# Patient Record
Sex: Female | Born: 1985 | Hispanic: No | Marital: Married
Health system: Southern US, Community
[De-identification: ages and names within clinical notes are randomized; demographics above are authoritative.]

---

## 2016-02-29 ENCOUNTER — Other Ambulatory Visit (HOSPITAL_COMMUNITY): Payer: Self-pay | Admitting: Obstetrics and Gynecology

## 2016-02-29 DIAGNOSIS — Z3A18 18 weeks gestation of pregnancy: Secondary | ICD-10-CM

## 2016-02-29 DIAGNOSIS — Z3689 Encounter for other specified antenatal screening: Secondary | ICD-10-CM

## 2016-02-29 LAB — OB RESULTS CONSOLE HEPATITIS B SURFACE ANTIGEN: Hepatitis B Surface Ag: NEGATIVE

## 2016-02-29 LAB — OB RESULTS CONSOLE HIV ANTIBODY (ROUTINE TESTING): HIV: NONREACTIVE

## 2016-02-29 LAB — OB RESULTS CONSOLE GC/CHLAMYDIA
Chlamydia: NEGATIVE
GC PROBE AMP, GENITAL: NEGATIVE

## 2016-02-29 LAB — OB RESULTS CONSOLE ABO/RH: RH TYPE: POSITIVE

## 2016-02-29 LAB — OB RESULTS CONSOLE ANTIBODY SCREEN: ANTIBODY SCREEN: NEGATIVE

## 2016-02-29 LAB — OB RESULTS CONSOLE RUBELLA ANTIBODY, IGM: Rubella: IMMUNE

## 2016-02-29 LAB — OB RESULTS CONSOLE RPR: RPR: NONREACTIVE

## 2016-03-15 ENCOUNTER — Encounter (HOSPITAL_COMMUNITY): Payer: Self-pay | Admitting: Obstetrics and Gynecology

## 2016-03-29 ENCOUNTER — Encounter (HOSPITAL_COMMUNITY): Payer: Self-pay | Admitting: *Deleted

## 2016-03-30 ENCOUNTER — Ambulatory Visit (HOSPITAL_COMMUNITY)
Admission: RE | Admit: 2016-03-30 | Discharge: 2016-03-30 | Disposition: A | Discharge status: Home / Self Care | Payer: Medicaid Other | Source: Ambulatory Visit | Attending: Obstetrics and Gynecology | Admitting: Obstetrics and Gynecology

## 2016-03-30 DIAGNOSIS — Z363 Encounter for antenatal screening for malformations: Secondary | ICD-10-CM | POA: Insufficient documentation

## 2016-03-30 DIAGNOSIS — Z3689 Encounter for other specified antenatal screening: Secondary | ICD-10-CM

## 2016-03-30 DIAGNOSIS — Z3A18 18 weeks gestation of pregnancy: Secondary | ICD-10-CM | POA: Insufficient documentation

## 2016-03-30 DIAGNOSIS — O34211 Maternal care for low transverse scar from previous cesarean delivery: Secondary | ICD-10-CM | POA: Insufficient documentation

## 2016-07-26 ENCOUNTER — Encounter (HOSPITAL_COMMUNITY): Payer: Self-pay

## 2016-08-29 ENCOUNTER — Telehealth (HOSPITAL_COMMUNITY): Payer: Self-pay | Admitting: *Deleted

## 2016-08-29 NOTE — Telephone Encounter (Signed)
Preadmission screen  

## 2016-09-02 ENCOUNTER — Encounter (HOSPITAL_COMMUNITY)
Admission: RE | Disposition: A | Discharge status: Home / Self Care | Payer: Self-pay | Source: Ambulatory Visit | Attending: Obstetrics & Gynecology

## 2016-09-02 ENCOUNTER — Inpatient Hospital Stay (HOSPITAL_COMMUNITY): Payer: 59 | Admitting: Anesthesiology

## 2016-09-02 ENCOUNTER — Encounter (HOSPITAL_COMMUNITY): Payer: Self-pay | Admitting: *Deleted

## 2016-09-02 ENCOUNTER — Inpatient Hospital Stay (HOSPITAL_COMMUNITY)
Admission: RE | Admit: 2016-09-02 | Discharge: 2016-09-04 | DRG: 766 | Disposition: A | Discharge status: Home / Self Care | Payer: 59 | Source: Ambulatory Visit | Attending: Obstetrics & Gynecology | Admitting: Obstetrics & Gynecology

## 2016-09-02 DIAGNOSIS — Z302 Encounter for sterilization: Secondary | ICD-10-CM | POA: Diagnosis not present

## 2016-09-02 DIAGNOSIS — O34211 Maternal care for low transverse scar from previous cesarean delivery: Secondary | ICD-10-CM

## 2016-09-02 DIAGNOSIS — Z3A40 40 weeks gestation of pregnancy: Secondary | ICD-10-CM | POA: Diagnosis not present

## 2016-09-02 DIAGNOSIS — O34219 Maternal care for unspecified type scar from previous cesarean delivery: Secondary | ICD-10-CM | POA: Diagnosis not present

## 2016-09-02 LAB — CBC
HEMATOCRIT: 39.7 % (ref 36.0–46.0)
HEMATOCRIT: 38.6 % (ref 36.0–46.0)
HEMOGLOBIN: 13.9 g/dL (ref 12.0–15.0)
Hemoglobin: 13.6 g/dL (ref 12.0–15.0)
MCH: 33.7 pg (ref 26.0–34.0)
MCH: 33.7 pg (ref 26.0–34.0)
MCHC: 35 g/dL (ref 30.0–36.0)
MCHC: 35.2 g/dL (ref 30.0–36.0)
MCV: 96.4 fL (ref 78.0–100.0)
MCV: 95.8 fL (ref 78.0–100.0)
Platelets: 229 10*3/uL (ref 150–400)
Platelets: 225 10*3/uL (ref 150–400)
RBC: 4.12 MIL/uL (ref 3.87–5.11)
RBC: 4.03 MIL/uL (ref 3.87–5.11)
RDW: 13.4 % (ref 11.5–15.5)
RDW: 13.1 % (ref 11.5–15.5)
WBC: 11.8 10*3/uL — AB (ref 4.0–10.5)
WBC: 20.4 10*3/uL — ABNORMAL HIGH (ref 4.0–10.5)

## 2016-09-02 LAB — TYPE AND SCREEN
ABO/RH(D): A POS
ANTIBODY SCREEN: NEGATIVE

## 2016-09-02 LAB — ABO/RH: ABO/RH(D): A POS

## 2016-09-02 SURGERY — Surgical Case
Anesthesia: Spinal | Site: Abdomen | Wound class: Clean Contaminated

## 2016-09-02 MED ORDER — SCOPOLAMINE 1 MG/3DAYS TD PT72: 1.0000 | MEDICATED_PATCH | Freq: Once | TRANSDERMAL | Status: DC

## 2016-09-02 MED ORDER — MEPERIDINE HCL 25 MG/ML IJ SOLN: 6.2500 mg | INTRAMUSCULAR | Status: DC | PRN

## 2016-09-02 MED ORDER — ONDANSETRON HCL 4 MG/2ML IJ SOLN
INTRAMUSCULAR | Status: DC | PRN
  Administered 2016-09-02: 4 mg via INTRAVENOUS

## 2016-09-02 MED ORDER — PHENYLEPHRINE HCL 10 MG/ML IJ SOLN
INTRAMUSCULAR | Status: DC | PRN
  Administered 2016-09-02 (×2): 40 ug via INTRAVENOUS

## 2016-09-02 MED ORDER — SOD CITRATE-CITRIC ACID 500-334 MG/5ML PO SOLN
ORAL | Status: AC
  Filled 2016-09-02: qty 15

## 2016-09-02 MED ORDER — DEXAMETHASONE SODIUM PHOSPHATE 10 MG/ML IJ SOLN
INTRAMUSCULAR | Status: DC | PRN
  Administered 2016-09-02: 10 mg via INTRAVENOUS

## 2016-09-02 MED ORDER — KETOROLAC TROMETHAMINE 30 MG/ML IJ SOLN
30.0000 mg | Freq: Once | INTRAMUSCULAR | Status: DC | PRN
  Administered 2016-09-02: 30 mg via INTRAVENOUS

## 2016-09-02 MED ORDER — FENTANYL CITRATE (PF) 100 MCG/2ML IJ SOLN
INTRAMUSCULAR | Status: AC
  Filled 2016-09-02: qty 2

## 2016-09-02 MED ORDER — NALBUPHINE HCL 10 MG/ML IJ SOLN: 5.0000 mg | Freq: Once | INTRAMUSCULAR | Status: DC | PRN

## 2016-09-02 MED ORDER — PHENYLEPHRINE 8 MG IN D5W 100 ML (0.08MG/ML) PREMIX OPTIME
INJECTION | INTRAVENOUS | Status: DC | PRN
  Administered 2016-09-02: 60 ug/min via INTRAVENOUS

## 2016-09-02 MED ORDER — NALOXONE HCL 2 MG/2ML IJ SOSY: 1.0000 ug/kg/h | PREFILLED_SYRINGE | INTRAVENOUS | Status: DC | PRN

## 2016-09-02 MED ORDER — ONDANSETRON HCL 4 MG/2ML IJ SOLN: 4.0000 mg | Freq: Three times a day (TID) | INTRAMUSCULAR | Status: DC | PRN

## 2016-09-02 MED ORDER — BUPIVACAINE IN DEXTROSE 0.75-8.25 % IT SOLN
INTRATHECAL | Status: DC | PRN
  Administered 2016-09-02: 12 mg via INTRATHECAL

## 2016-09-02 MED ORDER — KETOROLAC TROMETHAMINE 30 MG/ML IJ SOLN: 30.0000 mg | Freq: Four times a day (QID) | INTRAMUSCULAR | Status: AC | PRN

## 2016-09-02 MED ORDER — DIPHENHYDRAMINE HCL 25 MG PO CAPS: 25.0000 mg | ORAL_CAPSULE | Freq: Four times a day (QID) | ORAL | Status: DC | PRN

## 2016-09-02 MED ORDER — BUPIVACAINE IN DEXTROSE 0.75-8.25 % IT SOLN
INTRATHECAL | Status: AC
  Filled 2016-09-02: qty 2

## 2016-09-02 MED ORDER — KETOROLAC TROMETHAMINE 30 MG/ML IJ SOLN
INTRAMUSCULAR | Status: AC
  Filled 2016-09-02: qty 1

## 2016-09-02 MED ORDER — DIPHENHYDRAMINE HCL 50 MG/ML IJ SOLN: 12.5000 mg | INTRAMUSCULAR | Status: DC | PRN

## 2016-09-02 MED ORDER — TETANUS-DIPHTH-ACELL PERTUSSIS 5-2.5-18.5 LF-MCG/0.5 IM SUSP: 0.5000 mL | Freq: Once | INTRAMUSCULAR | Status: DC

## 2016-09-02 MED ORDER — IBUPROFEN 600 MG PO TABS
600.0000 mg | ORAL_TABLET | Freq: Four times a day (QID) | ORAL | Status: DC
  Administered 2016-09-03 – 2016-09-04 (×7): 600 mg via ORAL
  Filled 2016-09-02 (×7): qty 1

## 2016-09-02 MED ORDER — ONDANSETRON HCL 4 MG/2ML IJ SOLN
INTRAMUSCULAR | Status: AC
  Filled 2016-09-02: qty 2

## 2016-09-02 MED ORDER — SODIUM CHLORIDE 0.9 % IR SOLN
Status: DC | PRN
  Administered 2016-09-02: 1000 mL

## 2016-09-02 MED ORDER — SIMETHICONE 80 MG PO CHEW
80.0000 mg | CHEWABLE_TABLET | ORAL | Status: DC
Start: 2016-09-03 — End: 2016-09-04
  Administered 2016-09-03 – 2016-09-04 (×2): 80 mg via ORAL
  Filled 2016-09-02 (×2): qty 1

## 2016-09-02 MED ORDER — ERYTHROMYCIN 5 MG/GM OP OINT
TOPICAL_OINTMENT | OPHTHALMIC | Status: AC
  Filled 2016-09-02: qty 1

## 2016-09-02 MED ORDER — PRENATAL MULTIVITAMIN CH
1.0000 | ORAL_TABLET | Freq: Every day | ORAL | Status: DC
  Administered 2016-09-03 – 2016-09-04 (×2): 1 via ORAL
  Filled 2016-09-02 (×2): qty 1

## 2016-09-02 MED ORDER — PHENYLEPHRINE 8 MG IN D5W 100 ML (0.08MG/ML) PREMIX OPTIME
INJECTION | INTRAVENOUS | Status: AC
  Filled 2016-09-02: qty 100

## 2016-09-02 MED ORDER — NALBUPHINE HCL 10 MG/ML IJ SOLN: 5.0000 mg | INTRAMUSCULAR | Status: DC | PRN

## 2016-09-02 MED ORDER — LACTATED RINGERS IV SOLN
INTRAVENOUS | Status: DC
  Administered 2016-09-02: 23:00:00 via INTRAVENOUS

## 2016-09-02 MED ORDER — SIMETHICONE 80 MG PO CHEW: 80.0000 mg | CHEWABLE_TABLET | ORAL | Status: DC | PRN

## 2016-09-02 MED ORDER — ACETAMINOPHEN 325 MG PO TABS
650.0000 mg | ORAL_TABLET | ORAL | Status: DC | PRN
  Administered 2016-09-04: 650 mg via ORAL
  Filled 2016-09-02: qty 2

## 2016-09-02 MED ORDER — PHENYLEPHRINE 40 MCG/ML (10ML) SYRINGE FOR IV PUSH (FOR BLOOD PRESSURE SUPPORT)
PREFILLED_SYRINGE | INTRAVENOUS | Status: AC
  Filled 2016-09-02: qty 10

## 2016-09-02 MED ORDER — CEFAZOLIN SODIUM-DEXTROSE 2-4 GM/100ML-% IV SOLN
2.0000 g | Freq: Once | INTRAVENOUS | Status: AC
  Administered 2016-09-02: 2 g via INTRAVENOUS
  Filled 2016-09-02: qty 100

## 2016-09-02 MED ORDER — WITCH HAZEL-GLYCERIN EX PADS: 1.0000 "application " | MEDICATED_PAD | CUTANEOUS | Status: DC | PRN

## 2016-09-02 MED ORDER — SOD CITRATE-CITRIC ACID 500-334 MG/5ML PO SOLN
30.0000 mL | Freq: Once | ORAL | Status: AC
  Administered 2016-09-02: 30 mL via ORAL

## 2016-09-02 MED ORDER — ZOLPIDEM TARTRATE 5 MG PO TABS: 5.0000 mg | ORAL_TABLET | Freq: Every evening | ORAL | Status: DC | PRN

## 2016-09-02 MED ORDER — MENTHOL 3 MG MT LOZG: 1.0000 | LOZENGE | OROMUCOSAL | Status: DC | PRN

## 2016-09-02 MED ORDER — SCOPOLAMINE 1 MG/3DAYS TD PT72
MEDICATED_PATCH | TRANSDERMAL | Status: DC | PRN
  Administered 2016-09-02: 1 via TRANSDERMAL

## 2016-09-02 MED ORDER — DIPHENHYDRAMINE HCL 25 MG PO CAPS: 25.0000 mg | ORAL_CAPSULE | ORAL | Status: DC | PRN

## 2016-09-02 MED ORDER — SODIUM CHLORIDE 0.9% FLUSH: 3.0000 mL | INTRAVENOUS | Status: DC | PRN

## 2016-09-02 MED ORDER — OXYTOCIN 10 UNIT/ML IJ SOLN
INTRAMUSCULAR | Status: AC
  Filled 2016-09-02: qty 4

## 2016-09-02 MED ORDER — MORPHINE SULFATE (PF) 0.5 MG/ML IJ SOLN
INTRAMUSCULAR | Status: AC
  Filled 2016-09-02: qty 10

## 2016-09-02 MED ORDER — SCOPOLAMINE 1 MG/3DAYS TD PT72
MEDICATED_PATCH | TRANSDERMAL | Status: AC
  Filled 2016-09-02: qty 1

## 2016-09-02 MED ORDER — SENNOSIDES-DOCUSATE SODIUM 8.6-50 MG PO TABS
2.0000 | ORAL_TABLET | ORAL | Status: DC
  Administered 2016-09-03 – 2016-09-04 (×2): 2 via ORAL
  Filled 2016-09-02 (×2): qty 2

## 2016-09-02 MED ORDER — OXYTOCIN 40 UNITS IN LACTATED RINGERS INFUSION - SIMPLE MED: 2.5000 [IU]/h | INTRAVENOUS | Status: AC

## 2016-09-02 MED ORDER — NALOXONE HCL 0.4 MG/ML IJ SOLN: 0.4000 mg | INTRAMUSCULAR | Status: DC | PRN

## 2016-09-02 MED ORDER — MORPHINE SULFATE (PF) 0.5 MG/ML IJ SOLN
INTRAMUSCULAR | Status: DC | PRN
  Administered 2016-09-02: .2 mg via INTRATHECAL

## 2016-09-02 MED ORDER — DEXAMETHASONE SODIUM PHOSPHATE 10 MG/ML IJ SOLN
INTRAMUSCULAR | Status: AC
  Filled 2016-09-02: qty 1

## 2016-09-02 MED ORDER — DIBUCAINE 1 % RE OINT: 1.0000 "application " | TOPICAL_OINTMENT | RECTAL | Status: DC | PRN

## 2016-09-02 MED ORDER — BUPIVACAINE HCL (PF) 0.5 % IJ SOLN
INTRAMUSCULAR | Status: DC | PRN
  Administered 2016-09-02: 30 mL

## 2016-09-02 MED ORDER — HYDROMORPHONE HCL 1 MG/ML IJ SOLN: 0.2500 mg | INTRAMUSCULAR | Status: DC | PRN

## 2016-09-02 MED ORDER — BUPIVACAINE HCL (PF) 0.5 % IJ SOLN
INTRAMUSCULAR | Status: AC
  Filled 2016-09-02: qty 30

## 2016-09-02 MED ORDER — PROMETHAZINE HCL 25 MG/ML IJ SOLN: 6.2500 mg | INTRAMUSCULAR | Status: DC | PRN

## 2016-09-02 MED ORDER — OXYCODONE HCL 5 MG PO TABS: 5.0000 mg | ORAL_TABLET | Freq: Once | ORAL | Status: DC | PRN

## 2016-09-02 MED ORDER — SIMETHICONE 80 MG PO CHEW
80.0000 mg | CHEWABLE_TABLET | Freq: Three times a day (TID) | ORAL | Status: DC
  Administered 2016-09-03 – 2016-09-04 (×3): 80 mg via ORAL
  Filled 2016-09-02 (×5): qty 1

## 2016-09-02 MED ORDER — LACTATED RINGERS IV SOLN
INTRAVENOUS | Status: DC | PRN
  Administered 2016-09-02 (×2): via INTRAVENOUS

## 2016-09-02 MED ORDER — LACTATED RINGERS IV SOLN
INTRAVENOUS | Status: DC
  Administered 2016-09-02 (×2): via INTRAVENOUS

## 2016-09-02 MED ORDER — COCONUT OIL OIL
1.0000 "application " | TOPICAL_OIL | Status: DC | PRN
  Administered 2016-09-03: 1 via TOPICAL
  Filled 2016-09-02: qty 120

## 2016-09-02 MED ORDER — LACTATED RINGERS IV SOLN
INTRAVENOUS | Status: DC | PRN
  Administered 2016-09-02: 40 [IU] via INTRAVENOUS

## 2016-09-02 MED ORDER — OXYCODONE HCL 5 MG/5ML PO SOLN: 5.0000 mg | Freq: Once | ORAL | Status: DC | PRN

## 2016-09-02 SURGICAL SUPPLY — 29 items
CHLORAPREP W/TINT 26ML (MISCELLANEOUS) ×4 IMPLANT
CLAMP CORD UMBIL (MISCELLANEOUS) ×4 IMPLANT
CLIP FILSHIE TUBAL LIGA STRL (Clip) ×4 IMPLANT
CLOTH BEACON ORANGE TIMEOUT ST (SAFETY) ×4 IMPLANT
DRSG OPSITE POSTOP 4X10 (GAUZE/BANDAGES/DRESSINGS) ×4 IMPLANT
ELECT REM PT RETURN 9FT ADLT (ELECTROSURGICAL) ×4
ELECTRODE REM PT RTRN 9FT ADLT (ELECTROSURGICAL) ×2 IMPLANT
GLOVE BIO SURGEON STRL SZ 6.5 (GLOVE) ×3 IMPLANT
GLOVE BIO SURGEONS STRL SZ 6.5 (GLOVE) ×1
GLOVE BIOGEL PI IND STRL 7.0 (GLOVE) ×4 IMPLANT
GLOVE BIOGEL PI INDICATOR 7.0 (GLOVE) ×4
GOWN STRL REUS W/TWL LRG LVL3 (GOWN DISPOSABLE) ×8 IMPLANT
NEEDLE HYPO 22GX1.5 SAFETY (NEEDLE) ×4 IMPLANT
NS IRRIG 1000ML POUR BTL (IV SOLUTION) ×4 IMPLANT
PACK C SECTION WH (CUSTOM PROCEDURE TRAY) ×4 IMPLANT
PAD ABD 8X7 1/2 STERILE (GAUZE/BANDAGES/DRESSINGS) ×4 IMPLANT
PAD OB MATERNITY 4.3X12.25 (PERSONAL CARE ITEMS) ×4 IMPLANT
PENCIL SMOKE EVAC W/HOLSTER (ELECTROSURGICAL) ×4 IMPLANT
RETRACTOR WND ALEXIS 25 LRG (MISCELLANEOUS) ×2 IMPLANT
RTRCTR WOUND ALEXIS 25CM LRG (MISCELLANEOUS) ×4
SPONGE GAUZE 4X4 12PLY STER LF (GAUZE/BANDAGES/DRESSINGS) ×8 IMPLANT
SUT VIC AB 0 CT1 36 (SUTURE) ×24 IMPLANT
SUT VIC AB 2-0 CT1 27 (SUTURE) ×2
SUT VIC AB 2-0 CT1 TAPERPNT 27 (SUTURE) ×2 IMPLANT
SUT VIC AB 4-0 PS2 27 (SUTURE) ×4 IMPLANT
SYR CONTROL 10ML LL (SYRINGE) ×4 IMPLANT
TAPE CLOTH SURG 4X10 WHT LF (GAUZE/BANDAGES/DRESSINGS) ×4 IMPLANT
TOWEL OR 17X24 6PK STRL BLUE (TOWEL DISPOSABLE) ×4 IMPLANT
TRAY FOLEY BAG SILVER LF 14FR (SET/KITS/TRAYS/PACK) ×4 IMPLANT

## 2016-09-02 NOTE — Anesthesia Postprocedure Evaluation (Signed)
Anesthesia Post Note  Patient: Ashley Daniels  Procedure(s) Performed: Procedure(s) (LRB): CESAREAN SECTION WITH BILATERAL TUBAL LIGATION (N/A)     Patient location during evaluation: Mother Baby Anesthesia Type: Spinal Level of consciousness: awake and alert and oriented Pain management: pain level controlled Vital Signs Assessment: post-procedure vital signs reviewed and stable Respiratory status: spontaneous breathing and nonlabored ventilation Cardiovascular status: stable Postop Assessment: no headache, patient able to bend at knees, no backache, no signs of nausea or vomiting, adequate PO intake and spinal receding Anesthetic complications: no    Last Vitals:  Vitals:   09/02/16 1715 09/02/16 1754  BP: 124/71 133/76  Pulse: 71 81  Resp: 18 18  Temp: 36.9 C 36.3 C    Last Pain:  Vitals:   09/02/16 1715  TempSrc:   PainSc: 4    Pain Goal: Patients Stated Pain Goal: 4 (09/02/16 1223)               Laban EmperorMalinova,Erskin Zinda Hristova

## 2016-09-02 NOTE — Transfer of Care (Signed)
Immediate Anesthesia Transfer of Care Note  Patient: Ashley Daniels  Procedure(s) Performed: Procedure(s): CESAREAN SECTION WITH BILATERAL TUBAL LIGATION (N/A)  Patient Location: PACU  Anesthesia Type:Spinal  Level of Consciousness: awake, alert  and oriented  Airway & Oxygen Therapy: Patient Spontanous Breathing  Post-op Assessment: Report given to RN and Post -op Vital signs reviewed and stable  Post vital signs: Reviewed and stable  Last Vitals:  Vitals:   09/02/16 1223  BP: 122/71  Pulse: (!) 114  Resp: 18  Temp: 36.6 C    Last Pain:  Vitals:   09/02/16 1223  TempSrc: Oral      Patients Stated Pain Goal: 4 (09/02/16 1223)  Complications: No apparent anesthesia complications

## 2016-09-02 NOTE — H&P (Signed)
Ashley LabradorRekha Daniels is a 31 y.o. female presenting for repeat cesarean section and BTL 4653w5d G2P1001 She declines TOLAC. OB History    Gravida Para Term Preterm AB Living   2 1 1     1    SAB TAB Ectopic Multiple Live Births                 No past medical history on file. No past surgical history on file. Family History: family history is not on file. Social History:  has no tobacco, alcohol, and drug history on file.     Maternal Diabetes: No Genetic Screening: Declined Maternal Ultrasounds/Referrals: Normal Fetal Ultrasounds or other Referrals:  None Maternal Substance Abuse:  No Significant Maternal Medications:  None Significant Maternal Lab Results:  None Other Comments:  None  Review of Systems  All other systems reviewed and are negative.  Maternal Medical History:  Reason for admission: Cesarean section and BTL      Blood pressure 122/71, pulse (!) 114, temperature 97.9 F (36.6 C), temperature source Oral, resp. rate 18, height 5\' 2"  (1.575 m), weight 137 lb (62.1 kg), last menstrual period 11/22/2015. Maternal Exam:  Uterine Assessment: none  Abdomen: Patient reports no abdominal tenderness. Surgical scars: low transverse.   Introitus: not evaluated.   Cervix: not evaluated.   Physical Exam  Vitals reviewed. Constitutional: She is oriented to person, place, and time. She appears well-developed. No distress.  HENT:  Head: Normocephalic.  Neck: Neck supple.  Cardiovascular: Normal rate.   Respiratory: Effort normal. No respiratory distress.  GI: Soft.  gravid  Musculoskeletal: Normal range of motion.  Neurological: She is alert and oriented to person, place, and time.  Psychiatric: She has a normal mood and affect. Her behavior is normal.    Prenatal labs: ABO, Rh: A/Positive/-- (12/04 0000) Antibody: Negative (12/04 0000) Rubella: Immune (12/04 0000) RPR: Nonreactive (12/04 0000)  HBsAg: Negative (12/04 0000)  HIV: Non-reactive (12/04 0000)  GBS:      Assessment/Plan: The risks of cesarean section discussed with the patient included but were not limited to: bleeding which may require transfusion or reoperation; infection which may require antibiotics; injury to bowel, bladder, ureters or other surrounding organs; injury to the fetus; need for additional procedures including hysterectomy in the event of a life-threatening hemorrhage; placental abnormalities wth subsequent pregnancies, incisional problems, thromboembolic phenomenon and other postoperative/anesthesia complications. 31 y.o. G2P1001 with undesired fertility,status post vaginal delivery, desires permanent sterilization. Risks and benefits of tubal ligation with Filshie clips discussed with patient including permanence of method, bleeding, infection, injury to surrounding organs and need for additional procedures. Risk failure of 0.5-1% with increased risk of ectopic gestation if pregnancy occurs was also discussed with patient. Patient verbalized understanding and all questions were answered.  Currie ParisJames G. Debroah LoopArnold MD 09/02/2016 12:39 PM   The patient concurred with the proposed plan, giving informed written consent for the procedure.   Patient has been NPO since 0000 she will remain NPO for procedure. Anesthesia and OR aware. Preoperative prophylactic antibiotics and SCDs ordered on call to the OR.  To OR when ready.     Scheryl DarterJames Chariti Havel 09/02/2016, 12:37 PM

## 2016-09-02 NOTE — Anesthesia Preprocedure Evaluation (Signed)
Anesthesia Evaluation  Patient identified by MRN, date of birth, ID band Patient awake    Reviewed: Allergy & Precautions, NPO status , Patient's Chart, lab work & pertinent test results  Airway Mallampati: II  TM Distance: >3 FB Neck ROM: Full    Dental no notable dental hx.    Pulmonary neg pulmonary ROS,    Pulmonary exam normal breath sounds clear to auscultation       Cardiovascular negative cardio ROS Normal cardiovascular exam Rhythm:Regular Rate:Normal     Neuro/Psych negative neurological ROS  negative psych ROS   GI/Hepatic negative GI ROS, Neg liver ROS,   Endo/Other  negative endocrine ROS  Renal/GU negative Renal ROS     Musculoskeletal negative musculoskeletal ROS (+)   Abdominal   Peds  Hematology negative hematology ROS (+)   Anesthesia Other Findings   Reproductive/Obstetrics (+) Pregnancy                             Anesthesia Physical Anesthesia Plan  ASA: II  Anesthesia Plan: Spinal   Post-op Pain Management:    Induction:   PONV Risk Score and Plan: 2 and Ondansetron and Scopolamine patch - Pre-op  Airway Management Planned:   Additional Equipment:   Intra-op Plan:   Post-operative Plan:   Informed Consent: I have reviewed the patients History and Physical, chart, labs and discussed the procedure including the risks, benefits and alternatives for the proposed anesthesia with the patient or authorized representative who has indicated his/her understanding and acceptance.   Dental advisory given  Plan Discussed with: CRNA  Anesthesia Plan Comments:         Anesthesia Quick Evaluation

## 2016-09-02 NOTE — Anesthesia Procedure Notes (Signed)
Spinal  Patient location during procedure: OR Staffing Anesthesiologist: Nolon Nations Performed: anesthesiologist  Preanesthetic Checklist Completed: patient identified, site marked, surgical consent, pre-op evaluation, timeout performed, IV checked, risks and benefits discussed and monitors and equipment checked Spinal Block Patient position: sitting Prep: site prepped and draped and DuraPrep Patient monitoring: heart rate, continuous pulse ox and blood pressure Approach: midline Location: L3-4 Injection technique: single-shot Needle Needle type: Sprotte  Needle gauge: 24 G Needle length: 9 cm Additional Notes Expiration date of kit checked and confirmed. Patient tolerated procedure well, without complications.

## 2016-09-02 NOTE — Op Note (Signed)
Ashley Daniels PROCEDURE DATE: 09/02/2016  PREOPERATIVE DIAGNOSES: Intrauterine pregnancy at [redacted]w[redacted]d weeks gestation; elective repeat ; undesired fertility  POSTOPERATIVE DIAGNOSES: The same  PROCEDURE:Repeat Low Transverse Cesarean Section, Bilateral Tubal Sterilization using Filshie clips  SURGEON:  Dr. Scheryl Darter  ASSISTANT:  no  ANESTHESIOLOGIST: Dr. Brand Males  INDICATIONS: Ashley Daniels is a 31 y.o. [id number] at [redacted]w[redacted]d here for cesarean section and bilateral tubal sterilization secondary to the indications listed under preoperative diagnoses; please see preoperative note for further details.  The risks of surgery were discussed with the patient including but were not limited to: bleeding which may require transfusion or reoperation; infection which may require antibiotics; injury to bowel, bladder, ureters or other surrounding organs; injury to the fetus; need for additional procedures including hysterectomy in the event of a life-threatening hemorrhage; placental abnormalities wth subsequent pregnancies, incisional problems, thromboembolic phenomenon and other postoperative/anesthesia complications.  Patient also desires permanent sterilization.  Other reversible forms of contraception were discussed with patient; she declines all other modalities. Risks of procedure discussed with patient including but not limited to: risk of regret, permanence of method, bleeding, infection, injury to surrounding organs and need for additional procedures.  Failure risk of 1-2% with increased risk of ectopic gestation if pregnancy occurs was also discussed with patient.  The patient concurred with the proposed plan, giving informed written consent for the procedures.    FINDINGS:  Viable female infant in cephalic presentation. Clear amniotic fluid.  Intact placenta, three vessel cord.  Normal uterus, fallopian tubes and ovaries bilaterally. Fallopian tubes sterilized with Filshie clips bilaterally.  ANESTHESIA:  Spinal INTRAVENOUS FLUIDS: 2300 ml ESTIMATED BLOOD LOSS: 500 ml URINE OUTPUT:  125 ml SPECIMENS: Placenta sent to L&D COMPLICATIONS: None immediate  PROCEDURE IN DETAIL:  The patient preoperatively received intravenous antibiotics and had sequential compression devices applied to her lower extremities.   She was then taken to the operating room where spinal anesthesia was administered and was found to be adequate. She was then placed in a dorsal supine position with a leftward tilt, and prepped and draped in a sterile manner.  A foley catheter was placed into her bladder and attached to constant gravity.  After an adequate timeout was performed, a Pfannenstiel skin incision was made with scalpel over her preexisting scar and carried through to the underlying layer of fascia. The fascia was incised in the midline, and this incision was extended bilaterally using the Mayo scissors.  Kocher clamps were applied to the superior aspect of the fascial incision and the underlying rectus muscles were dissected off bluntly. A similar process was carried out on the inferior aspect of the fascial incision. The rectus muscles were separated in the midline bluntly and the peritoneum was entered bluntly. Attention was turned to the lower uterine segment where a low transverse hysterotomy was made with a scalpel and extended bilaterally bluntly.  The infant was successfully delivered, the cord was clamped and cut after one minute, and the infant was handed over to awaiting neonatology team. Uterine massage was then administered, and the placenta delivered intact with a three-vessel cord. The uterus was then cleared of clot and debris.  The hysterotomy was closed with 0 Vicryl in a running locked fashion, and an imbricating layer was also placed with 0 Vicryl.   Attention was then turned to the fallopian tubes, and Filshie clips were placed about 3 cm from the cornua, with care given to incorporate the underlying mesosalpinx  on both sides, allowing for bilateral tubal sterilization. The  pelvis was cleared of all clot and debris. Hemostasis was confirmed on all surfaces.  The peritoneum was reapproximated using 2-0 Vicryl running stitch. The fascia was then closed using 0 Vicryl in a running fashion.  The subcutaneous layer was irrigated,  and 30 ml of 0.5% Marcaine was injected subcutaneously around the incision.  The skin was closed with a 4-0 Vicryl subcuticular stitch. The patient tolerated the procedure well. Sponge, lap, instrument and needle counts were correct x 3.  She was taken to the recovery room in stable condition.   Ashley Daniels, James G, MD 09/02/2016 2:47 PM

## 2016-09-02 NOTE — Addendum Note (Signed)
Addendum  created 09/02/16 1827 by Elgie CongoMalinova, Spenser Harren H, CRNA   Sign clinical note

## 2016-09-02 NOTE — Anesthesia Postprocedure Evaluation (Signed)
Anesthesia Post Note  Patient: Ashley Daniels  Procedure(s) Performed: Procedure(s) (LRB): CESAREAN SECTION WITH BILATERAL TUBAL LIGATION (N/A)     Patient location during evaluation: PACU Anesthesia Type: Spinal Level of consciousness: oriented and awake and alert Pain management: pain level controlled Vital Signs Assessment: post-procedure vital signs reviewed and stable Respiratory status: spontaneous breathing, respiratory function stable and patient connected to nasal cannula oxygen Cardiovascular status: blood pressure returned to baseline and stable Postop Assessment: no headache, no backache and spinal receding Anesthetic complications: no    Last Vitals:  Vitals:   09/02/16 1555 09/02/16 1600  BP: 105/71 107/68  Pulse: 89 90  Resp: 17 19  Temp:      Last Pain:  Vitals:   09/02/16 1617  TempSrc:   PainSc: 2    Pain Goal: Patients Stated Pain Goal: 4 (09/02/16 1223)               Lewie LoronJohn Ovie Cornelio

## 2016-09-03 DIAGNOSIS — O34211 Maternal care for low transverse scar from previous cesarean delivery: Secondary | ICD-10-CM | POA: Diagnosis not present

## 2016-09-03 DIAGNOSIS — Z3A40 40 weeks gestation of pregnancy: Secondary | ICD-10-CM | POA: Diagnosis not present

## 2016-09-03 LAB — CBC
HEMATOCRIT: 32.5 % — AB (ref 36.0–46.0)
Hemoglobin: 11.4 g/dL — ABNORMAL LOW (ref 12.0–15.0)
MCH: 33.5 pg (ref 26.0–34.0)
MCHC: 35.1 g/dL (ref 30.0–36.0)
MCV: 95.6 fL (ref 78.0–100.0)
PLATELETS: 205 10*3/uL (ref 150–400)
RBC: 3.4 MIL/uL — ABNORMAL LOW (ref 3.87–5.11)
RDW: 13.3 % (ref 11.5–15.5)
WBC: 16.8 10*3/uL — AB (ref 4.0–10.5)

## 2016-09-03 LAB — RPR
RPR Ser Ql: NONREACTIVE
RPR Ser Ql: NONREACTIVE

## 2016-09-03 MED ORDER — OXYCODONE HCL 5 MG PO TABS
5.0000 mg | ORAL_TABLET | ORAL | Status: DC | PRN
  Administered 2016-09-03: 5 mg via ORAL
  Filled 2016-09-03: qty 1

## 2016-09-03 NOTE — Progress Notes (Signed)
Subjective: Postpartum Day 1: Cesarean Delivery Patient reports incisional pain, tolerating PO and no problems voiding.    Objective: Vital signs in last 24 hours: Temp:  [97.4 F (36.3 C)-99.3 F (37.4 C)] 99.3 F (37.4 C) (06/09 0806) Pulse Rate:  [68-114] 76 (06/09 0806) Resp:  [12-26] 18 (06/09 0806) BP: (95-133)/(41-83) 100/41 (06/09 0806) SpO2:  [95 %-100 %] 95 % (06/09 0806) Weight:  [137 lb (62.1 kg)] 137 lb (62.1 kg) (06/08 1223)  Physical Exam:  General: alert, cooperative, appears stated age and no distress Lochia: appropriate Uterine Fundus: firm Incision: healing well, no significant drainage, no dehiscence DVT Evaluation: No evidence of DVT seen on physical exam.   Recent Labs  09/02/16 1825 09/03/16 0536  HGB 13.6 11.4*  HCT 38.6 32.5*    Assessment/Plan: Status post Cesarean section. Doing well postoperatively.  Continue current care.  Ashley Daniels 09/03/2016, 11:15 AM

## 2016-09-03 NOTE — Lactation Note (Signed)
This note was copied from a baby's chart. Lactation Consultation Note  Patient Name: Ashley Maryclare LabradorRekha Marinello WJXBJ'YToday's Date: 09/03/2016 Reason for consult: Initial assessment;Infant < 6lbs Mom latching baby in cradle hold but baby not obtaining/sustaining good depth. Assisted Mom with positioning to obtain more depth with latch. Mom reports baby sleepy at breast, she has started to supplement. Reviewed risk of early supplementation to BF success, discussed normal newborn behaviors in 1st 24 hours. Mom reports she is BF before giving any bottles. Encouraged to BF with feeding ques, both breasts before offering bottles. Advised baby should be at breast 8-12 times in 24 hours and with feeding ques. Reviewed tummy size. Supplemental guidelines reviewed with and given to parents. Lactation brochure left for review, advised of OP services and support group. Mom has small blister of right nipple. LC observed Mom pulling at breast to stimulate baby to suckle. Encouraged to support breast with feeding, be sure lips are well flanged. Reviewed massage with baby at breast. Advised Mom to apply EBM to sore nipple, RN will bring Mom coconut oil. Mom to call for assist as needed.   Maternal Data Has patient been taught Hand Expression?: Yes Does the patient have breastfeeding experience prior to this delivery?: Yes  Feeding Feeding Type: Breast Fed Length of feed: 10 min  LATCH Score/Interventions Latch: Repeated attempts needed to sustain latch, nipple held in mouth throughout feeding, stimulation needed to elicit sucking reflex. Intervention(s): Adjust position;Assist with latch;Breast massage;Breast compression  Audible Swallowing: A few with stimulation  Type of Nipple: Everted at rest and after stimulation  Comfort (Breast/Nipple): Filling, red/small blisters or bruises, mild/mod discomfort  Problem noted: Cracked, bleeding, blisters, bruises;Mild/Moderate discomfort Interventions   (Cracked/bleeding/bruising/blister): Expressed breast milk to nipple (coconut oil prn)  Hold (Positioning): Assistance needed to correctly position infant at breast and maintain latch. Intervention(s): Breastfeeding basics reviewed;Support Pillows;Position options;Skin to skin  LATCH Score: 6  Lactation Tools Discussed/Used WIC Program: Yes   Consult Status Consult Status: Follow-up Date: 09/04/16 Follow-up type: In-patient    Ashley Daniels, Cristle Jared Ann 09/03/2016, 10:32 AM

## 2016-09-04 DIAGNOSIS — Z3A40 40 weeks gestation of pregnancy: Secondary | ICD-10-CM | POA: Diagnosis not present

## 2016-09-04 DIAGNOSIS — O34211 Maternal care for low transverse scar from previous cesarean delivery: Secondary | ICD-10-CM | POA: Diagnosis not present

## 2016-09-04 MED ORDER — OXYCODONE HCL 5 MG PO TABS: 5.0000 mg | ORAL_TABLET | ORAL | 0 refills | Status: AC | PRN

## 2016-09-04 MED ORDER — IBUPROFEN 600 MG PO TABS: 600.0000 mg | ORAL_TABLET | Freq: Four times a day (QID) | ORAL | 0 refills | Status: AC

## 2016-09-04 NOTE — Lactation Note (Signed)
This note was copied from a baby's chart. Lactation Consultation Note  Patient Name: Ashley Maryclare LabradorRekha Wojtaszek ZOXWR'UToday's Date: 09/04/2016 Reason for consult: Follow-up assessment Mom feels breastfeeding is going well.  Parents are supplementing with small amounts of formula when baby still acting hungry.  Observed mom latch baby easily using side lying position.  Baby has a small mouth so depth at breast is challenging.  Reviewed waking techniques and breast massage during feeding.  Recommended baby wean from formula once breasts become full in order to establish good milk supply.  Lactation outpatient services reviewed and encouraged prn.  Maternal Data    Feeding Feeding Type: Breast Fed Length of feed: 20 min  LATCH Score/Interventions Latch: Grasps breast easily, tongue down, lips flanged, rhythmical sucking. Intervention(s): Breast massage;Breast compression  Audible Swallowing: A few with stimulation Intervention(s): Alternate breast massage  Type of Nipple: Everted at rest and after stimulation  Comfort (Breast/Nipple): Soft / non-tender     Hold (Positioning): No assistance needed to correctly position infant at breast. Intervention(s): Breastfeeding basics reviewed  LATCH Score: 9  Lactation Tools Discussed/Used     Consult Status Consult Status: Complete    Huston FoleyMOULDEN, Sidra Oldfield S 09/04/2016, 11:50 AM

## 2016-09-04 NOTE — Discharge Summary (Signed)
    OB Discharge Summary     Patient Name: Ashley LabradorRekha Lirette DOB: 22-Mar-1986 MRN: [id number]  Date of admission: 09/02/2016 Delivering MD: Adam PhenixARNOLD, JAMES G   Date of discharge: 09/04/2016  Admitting diagnosis: REPEAT  Intrauterine pregnancy: 5671w5d     Secondary diagnosis:  Active Problems:   Previous cesarean delivery, antepartum   Postpartum state  Additional problems: none     Discharge diagnosis: Term Pregnancy Delivered                                                                                                Post partum procedures:postpartum tubal ligation  Augmentation: N/A  Complications: None  Hospital course:  Sceduled C/S   31 y.o. yo G2P2002 at 4771w5d was admitted to the hospital 09/02/2016 for scheduled cesarean section with the following indication:Elective Repeat.  Membrane Rupture Time/Date: 1:47 PM ,09/02/2016   Patient delivered a Viable infant.09/02/2016  Details of operation can be found in separate operative note.  Pateint had an uncomplicated postpartum course.  She is ambulating, tolerating a regular diet, passing flatus, and urinating well. Patient is discharged home in stable condition on  09/04/16         Physical exam  Vitals:   09/03/16 0806 09/03/16 1253 09/03/16 1744 09/04/16 0600  BP: (!) 100/41 (!) 104/45 (!) 108/51 (!) 113/59  Pulse: 76 90 78 70  Resp: 18 20 18 18   Temp: 99.3 F (37.4 C) 98.3 F (36.8 C) 98.1 F (36.7 C) 98.2 F (36.8 C)  TempSrc: Oral Axillary Axillary Oral  SpO2: 95% 97% 99%   Weight:      Height:       Exam (see prior note)  Labs: Lab Results  Component Value Date   WBC 16.8 (H) 09/03/2016   HGB 11.4 (L) 09/03/2016   HCT 32.5 (L) 09/03/2016   MCV 95.6 09/03/2016   PLT 205 09/03/2016   No flowsheet data found.  Discharge instruction: per After Visit Summary and "Baby and Me Booklet".  After visit meds:  Allergies as of 09/04/2016   No Known Allergies     Medication List    TAKE these medications   ibuprofen  600 MG tablet Commonly known as:  ADVIL,MOTRIN Take 1 tablet (600 mg total) by mouth every 6 (six) hours.   oxyCODONE 5 MG immediate release tablet Commonly known as:  Oxy IR/ROXICODONE Take 1 tablet (5 mg total) by mouth every 4 (four) hours as needed for moderate pain.   prenatal multivitamin Tabs tablet Take 1 tablet by mouth at bedtime.       Diet: routine diet  Activity: Advance as tolerated. Pelvic rest for 6 weeks.   Outpatient follow up:4-5 weeks Follow up Appt:No future appointments. Follow up Visit:No Follow-up on file.  Postpartum contraception: Tubal Ligation  Newborn Data: Live born female  Birth Weight: 5 lb 13.5 oz (2650 g) APGAR: 8, 9  Baby Feeding: Breast Disposition:home with mother   09/04/2016 Elsie LincolnKelly Starlin Steib, MD

## 2016-09-04 NOTE — Progress Notes (Signed)
Post Partum Day 2  Subjective:  Maryclare LabradorRekha Wallington is a 31 y.o. L2G4010G2P2002 294w5d s/p C-section and BTL.  No acute events overnight.  Pt denies problems with ambulating, voiding or po intake.  She denies nausea or vomiting.  Pain is well controlled.  She has had flatus. She has had bowel movement.  Lochia Minimal. Method of Feeding: breast and bottle.   Objective: BP (!) 108/51 (BP Location: Right Arm)   Pulse 78   Temp 98.1 F (36.7 C) (Axillary)   Resp 18   Ht 5\' 2"  (1.575 m)   Wt 62.1 kg (137 lb)   LMP 11/22/2015   SpO2 99%   Breastfeeding? Unknown   BMI 25.06 kg/m   Physical Exam:  General: alert, cooperative and no distress Lochia:normal flow Chest: CTAB Heart: RRR no m/r/g Abdomen: soft, nontender; fundus firm DVT Evaluation: No evidence of DVT seen on physical exam. Extremities: no edema  Recent Labs  09/02/16 1825 09/03/16 0536  HGB 13.6 11.4*  HCT 38.6 32.5*    Assessment/Plan:  ASSESSMENT: Maryclare LabradorRekha Cacioppo is a 31 y.o. G2P2002 564w5d ppd #2 s/p C-section, doing well. Continue routine care; plan for discharge tomorrow.    LOS: 2 days   Thurnell Loselessandra G Lief Palmatier 09/04/2016, 5:32 AM

## 2016-09-05 ENCOUNTER — Inpatient Hospital Stay (HOSPITAL_COMMUNITY): Admission: RE | Admit: 2016-09-05 | Payer: Medicaid Other | Source: Ambulatory Visit

## 2016-09-05 ENCOUNTER — Encounter (HOSPITAL_COMMUNITY): Payer: Self-pay

## 2018-02-28 IMAGING — US US MFM OB COMP +14 WKS
1 series · 14 of 28 positions shown · non-contrast
Comparison: none

[Series 1: us mfm ob comp +14 wks · 105 acquisitions, 14 frames shown]
[im 4/105]
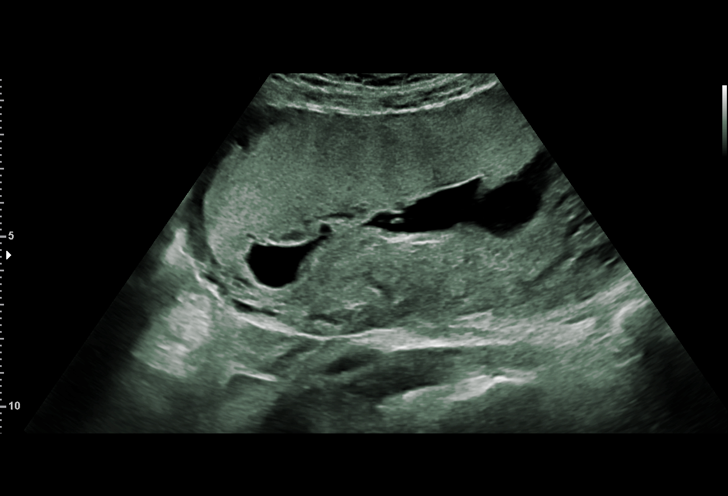
[im 12/105]
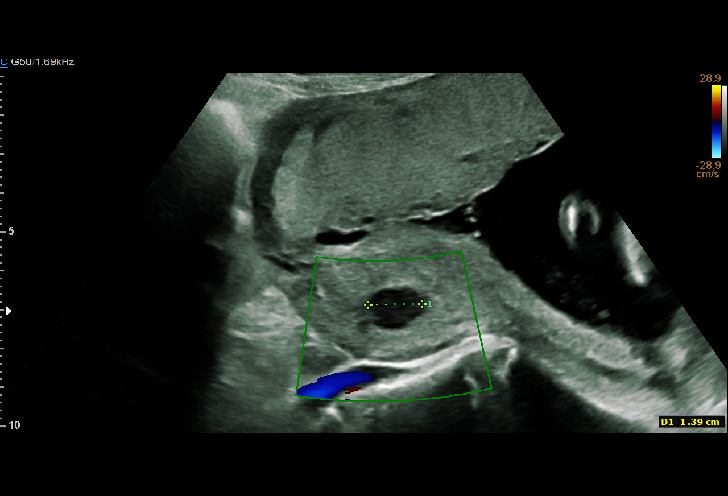
[im 20/105]
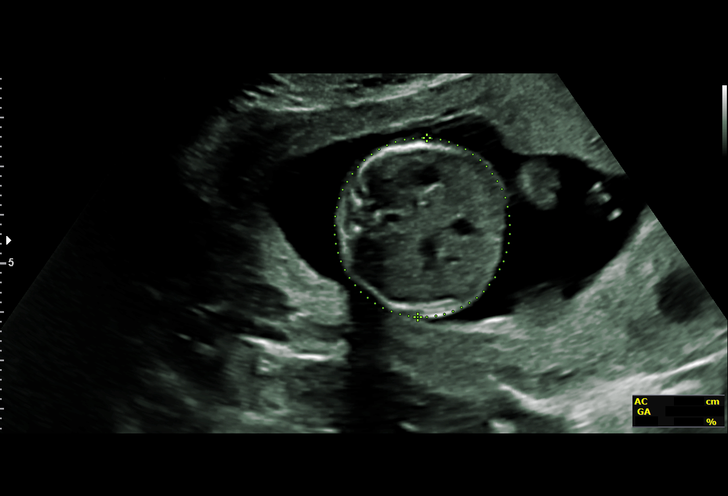
[im 27/105]
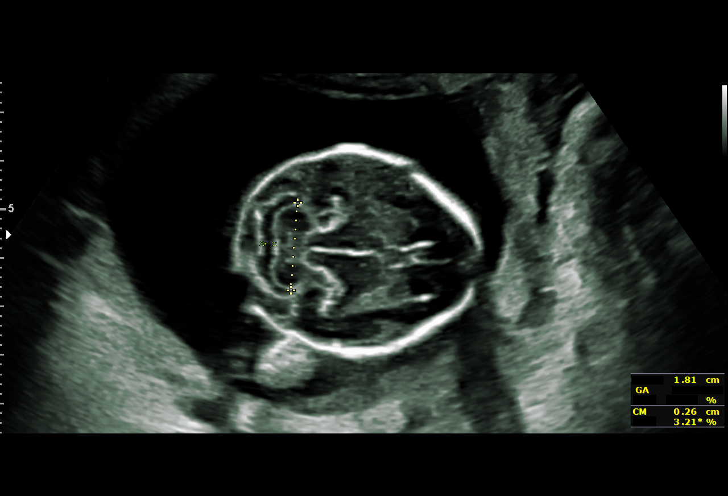
[im 35/105]
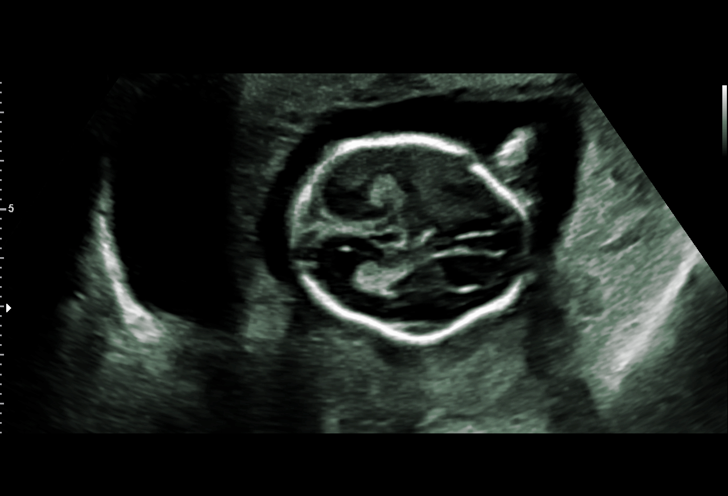
[im 43/105]
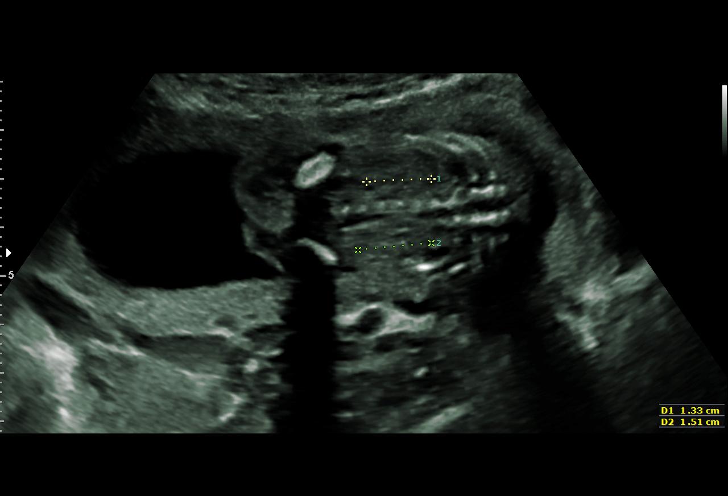
[im 51/105]
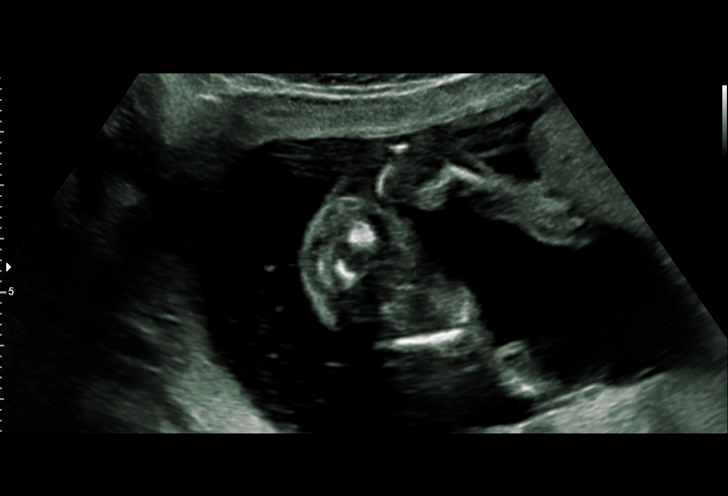
[im 58/105]
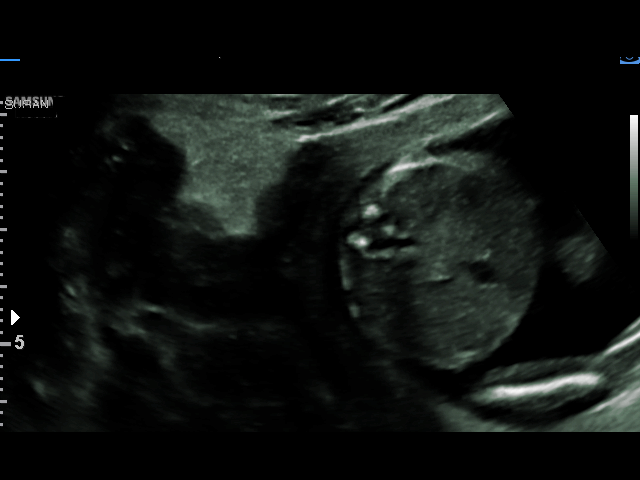
[im 66/105]
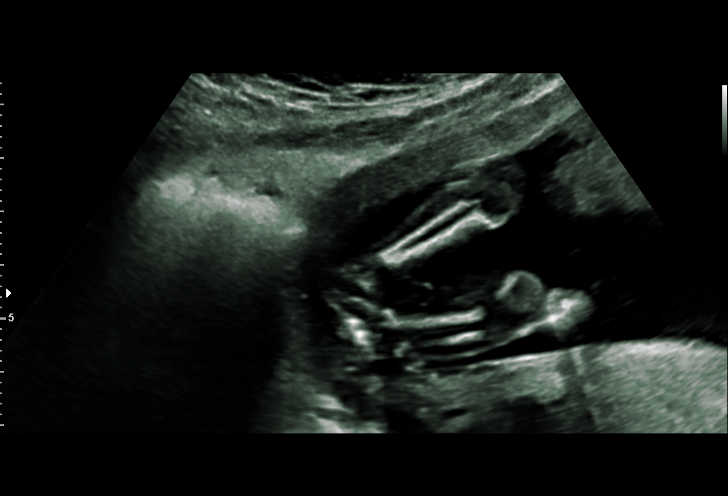
[im 74/105]
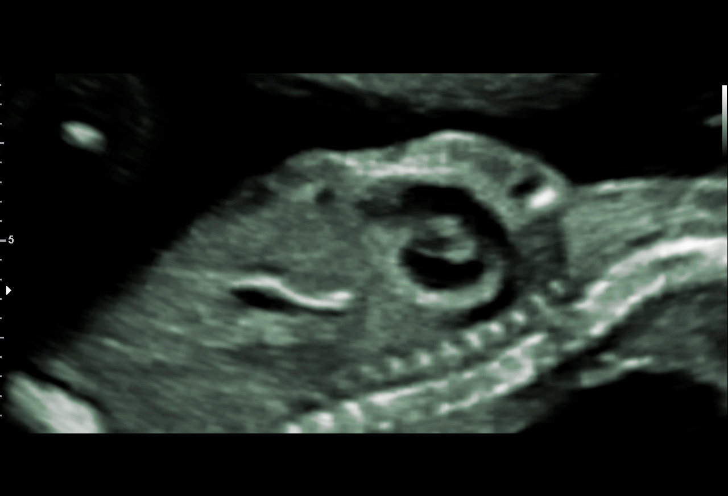
[im 81/105]
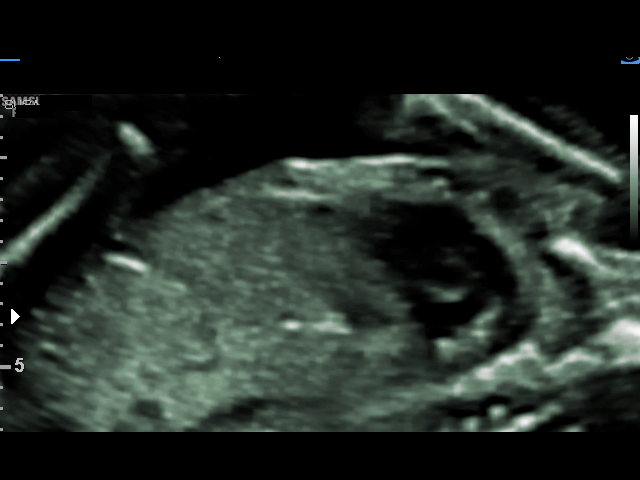
[im 89/105]
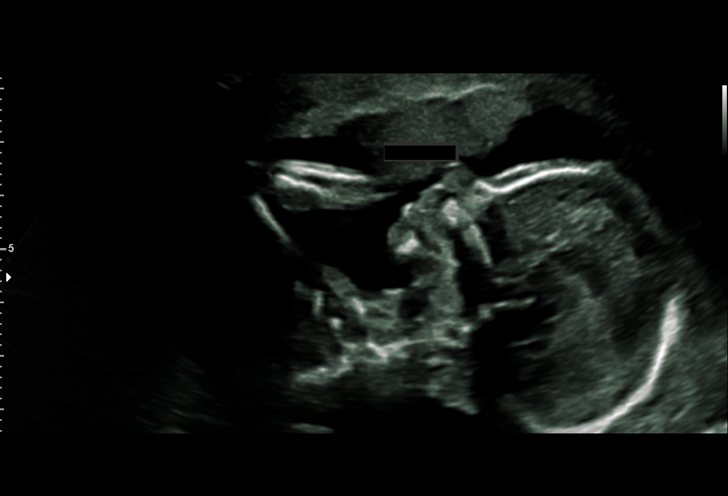
[im 97/105]
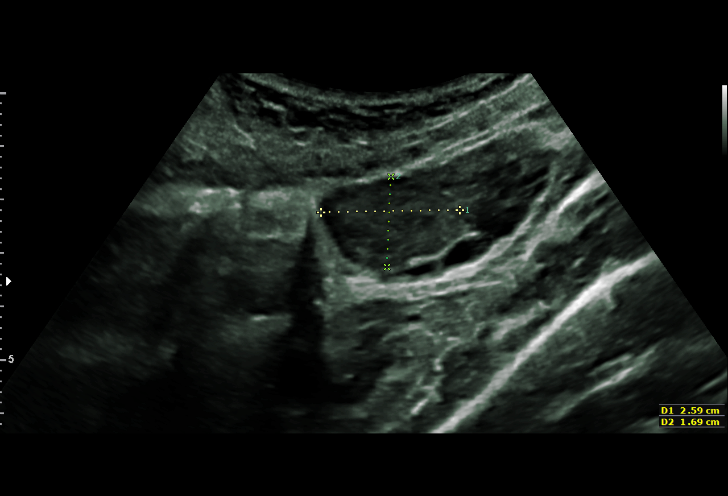
[im 105/105]
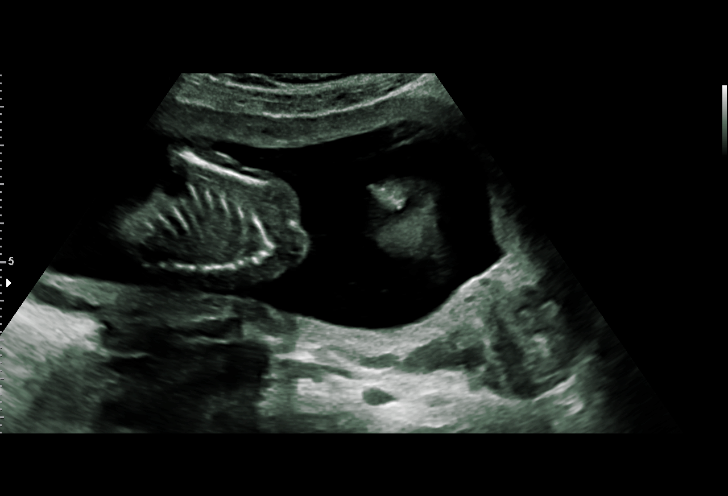

[14 of 28 positions shown; findings below may reference images not displayed]

[REDACTED]
[address],[HOSPITAL][HOSPITAL]
[REDACTED] [REDACTED][HOSPITAL]

1  NIKOLA ZMAJ                  [id number]      [id number]     [id number]
[facility]
Indications

18 weeks gestation of pregnancy
Antenatal screening for malformations
History of cesarean delivery, currently
pregnant
OB History

Gravidity:    2         Term:   1
Living:       1
Fetal Evaluation

Num Of Fetuses:     1
Fetal Heart         146
Rate(bpm):
Cardiac Activity:   Observed
Presentation:       Cephalic
Placenta:           Anterior, above cervical os
P. Cord Insertion:  Visualized, central

Amniotic Fluid
AFI FV:      Subjectively within normal limits
Largest Pocket(cm)
4.6
Biometry

BPD:      40.7  mm     G. Age:  18w 2d         47  %    CI:         73.03  %    70 - 86
FL/HC:       16.8  %    15.8 - 18
HC:      151.4  mm     G. Age:  18w 1d         30  %    HC/AC:       1.30       1.07 -
AC:      116.3  mm     G. Age:  17w 3d         17  %    FL/BPD:      62.7  %
FL:       25.5  mm     G. Age:  17w 5d         21  %    FL/AC:       21.9  %    20 - 24
HUM:      25.2  mm     G. Age:  18w 0d         38  %
CER:      18.1  mm     G. Age:  18w 0d         36  %
NFT:       3.4  mm
CM:        2.6  mm

Est. FW:     203   gm     0 lb 7 oz     30  %
Gestational Age

LMP:           18w 3d        Date:  11/22/15                 EDD:    08/28/16
U/S Today:     17w 6d                                        EDD:    09/01/16
Best:          18w 3d     Det. By:  LMP  (11/22/15)          EDD:    08/28/16
Anatomy

Cranium:               Appears normal         Aortic Arch:            Appears normal
Cavum:                 Appears normal         Ductal Arch:            Appears normal
Ventricles:            Appears normal         Diaphragm:              Appears normal
Choroid Plexus:        Appears normal         Stomach:                Appears normal, left
sided
Cerebellum:            Appears normal         Abdomen:                Appears normal
Posterior Fossa:       Appears normal         Abdominal Wall:         Appears nml (cord
insert, abd wall)
Nuchal Fold:           Appears normal         Cord Vessels:           Appears normal (3
vessel cord)
Face:                  Appears normal         Kidneys:                Appear normal
(orbits and profile)
Lips:                  Appears normal         Bladder:                Appears normal
Thoracic:              Appears normal         Spine:                  Appears normal
Heart:                 Appears normal         Upper Extremities:      Appears normal
(4CH, axis, and
situs)
RVOT:                  Appears normal         Lower Extremities:      Appears normal
LVOT:                  Appears normal

Other:  Nasal bone visualized. Heels visualized. Female gender.
Cervix Uterus Adnexa

Cervix
Length:              3  cm.
Normal appearance by transabdominal scan.

Uterus
2.5x2.2x2.7 cm sonolucency mid posterior uterus.

Left Ovary
Within normal limits.

Right Ovary
Within normal limits.
Impression

SIUP at 18+3 weeks
Normal detailed fetal anatomy
Markers of aneuploidy: none
Normal amniotic fluid volume
Measurements consistent with LMP dating
Recommendations

Follow-up as clinically indicated
